# Patient Record
Sex: Male | Born: 2000 | Race: Black or African American | Hispanic: No | Marital: Single | Smoking: Never smoker
Health system: Southern US, Community
[De-identification: ages and names within clinical notes are randomized; demographics above are authoritative.]

---
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
## 2021-03-06 ENCOUNTER — Ambulatory Visit (HOSPITAL_COMMUNITY)
Admission: EM | Admit: 2021-03-06 | Discharge: 2021-03-06 | Disposition: A | Discharge status: Home / Self Care | Payer: Medicaid Other | Attending: Physician Assistant | Admitting: Physician Assistant

## 2021-03-06 ENCOUNTER — Encounter (HOSPITAL_COMMUNITY): Payer: Self-pay

## 2021-03-06 ENCOUNTER — Other Ambulatory Visit: Payer: Self-pay

## 2021-03-06 DIAGNOSIS — R55 Syncope and collapse: Secondary | ICD-10-CM | POA: Diagnosis not present

## 2021-03-06 DIAGNOSIS — R42 Dizziness and giddiness: Secondary | ICD-10-CM | POA: Diagnosis not present

## 2021-03-06 LAB — COMPREHENSIVE METABOLIC PANEL
ALT: 15 U/L (ref 0–44)
AST: 18 U/L (ref 15–41)
Albumin: 4.5 g/dL (ref 3.5–5.0)
Alkaline Phosphatase: 52 U/L (ref 38–126)
Anion gap: 6 (ref 5–15)
BUN: 12 mg/dL (ref 6–20)
CO2: 28 mmol/L (ref 22–32)
Calcium: 9.5 mg/dL (ref 8.9–10.3)
Chloride: 106 mmol/L (ref 98–111)
Creatinine, Ser: 0.94 mg/dL (ref 0.61–1.24)
GFR, Estimated: >60 mL/min (ref >60)
Glucose, Bld: 104 mg/dL — ABNORMAL HIGH (ref 70–99)
Potassium: 4.2 mmol/L (ref 3.5–5.1)
Sodium: 140 mmol/L (ref 135–145)
Total Bilirubin: 0.5 mg/dL (ref 0.3–1.2)
Total Protein: 7.4 g/dL (ref 6.5–8.1)

## 2021-03-06 LAB — CBC WITH DIFFERENTIAL/PLATELET
Abs Immature Granulocytes: 0 10*3/uL (ref 0.00–0.07)
Basophils Absolute: 0 10*3/uL (ref 0.0–0.1)
Basophils Relative: 1 %
Eosinophils Absolute: 0 10*3/uL (ref 0.0–0.5)
Eosinophils Relative: 0 %
HCT: 41.1 % (ref 39.0–52.0)
Hemoglobin: 12.6 g/dL — ABNORMAL LOW (ref 13.0–17.0)
Lymphocytes Relative: 74 %
Lymphs Abs: 3.3 10*3/uL (ref 0.7–4.0)
MCH: 19.6 pg — ABNORMAL LOW (ref 26.0–34.0)
MCHC: 30.7 g/dL (ref 30.0–36.0)
MCV: 64 fL — ABNORMAL LOW (ref 80.0–100.0)
Monocytes Absolute: 0.2 10*3/uL (ref 0.1–1.0)
Monocytes Relative: 4 %
Neutro Abs: 0.9 10*3/uL — ABNORMAL LOW (ref 1.7–7.7)
Neutrophils Relative %: 21 %
Platelets: 238 10*3/uL (ref 150–400)
RBC: 6.42 MIL/uL — ABNORMAL HIGH (ref 4.22–5.81)
RDW: 16.6 % — ABNORMAL HIGH (ref 11.5–15.5)
WBC: 4.4 10*3/uL (ref 4.0–10.5)
nRBC: 0 % (ref 0.0–0.2)

## 2021-03-06 LAB — POCT URINALYSIS DIPSTICK, ED / UC
Bilirubin Urine: NEGATIVE
Glucose, UA: NEGATIVE mg/dL
Leukocytes,Ua: NEGATIVE
Nitrite: NEGATIVE
Protein, ur: NEGATIVE mg/dL
Specific Gravity, Urine: >=1.03 (ref 1.005–1.030)
Urobilinogen, UA: 0.2 mg/dL (ref 0.0–1.0)
pH: 5.5 (ref 5.0–8.0)

## 2021-03-06 LAB — CBG MONITORING, ED: Glucose-Capillary: 94 mg/dL (ref 70–99)

## 2021-03-06 NOTE — Discharge Instructions (Addendum)
Your EKG and blood glucose were normal.  We will contact you if your lab work is abnormal.  Your heart rate did elevate when you change positions.  I would like you to eat small frequent meals that are high in salt and drink lots of fluid.  Please contact cardiology to schedule follow-up appointment for further evaluation.  If you have any worsening symptoms including recurrent episodes, passing out, chest pain, heart racing, shortness of breath you need to be seen immediately.

## 2021-03-06 NOTE — ED Provider Notes (Signed)
[facility]    CSN: [id number] Arrival date & time: 03/06/21  4010      History   Chief Complaint Chief Complaint  Patient presents with   Fatigue    HPI Patrick Cochran is a 20 y.o. male.   Patient presents today companied by his father who help provide the majority of history.  Reports that yesterday when he got to work he was standing there and then suddenly felt weak and fatigued.  He denies any loss of consciousness.  Reports that this episode lasted for few minutes and then resolved without intervention.  He does report that he had not eaten prior to symptoms.  He denies any history of diabetes or hypoglycemia.  He denies personal or family history of arrhythmia.  He denies any medication changes or recent illness.  Reports that he is feeling normal and denies any significant symptoms including lightheadedness, headache, dizziness, chest pain, shortness of breath, body aches.  He denies any recent head trauma.  He does have a history of anemia when he was much younger as well as a strong family history of anemia and multiple family members.  He is interested in having lab work obtained today.  He denies any melena, hematochezia, hematuria.   History reviewed. No pertinent past medical history.  There are no problems to display for this patient.   History reviewed. No pertinent surgical history.     Home Medications    Prior to Admission medications   Not on File    Family History Family History  Problem Relation Age of Onset   Healthy Mother    Anemia Mother    Healthy Father    Anemia Sister     Social History Social History   Tobacco Use   Smoking status: Never   Smokeless tobacco: Never  Vaping Use   Vaping Use: Never used  Substance Use Topics   Alcohol use: No   Drug use: Not Currently     Allergies   Patient has no known allergies.   Review of Systems Review of Systems  Constitutional:  Positive for fatigue. Negative for  activity change, appetite change and fever.  HENT:  Negative for congestion, sinus pressure, sneezing and sore throat.   Respiratory:  Negative for cough and shortness of breath.   Cardiovascular:  Negative for chest pain, palpitations and leg swelling.  Gastrointestinal:  Negative for abdominal pain, diarrhea, nausea and vomiting.  Musculoskeletal:  Negative for arthralgias and myalgias.  Neurological:  Positive for light-headedness. Negative for dizziness, weakness and headaches.    Physical Exam Triage Vital Signs ED Triage Vitals  Enc Vitals Group     BP 03/06/21 0955 115/78     Pulse Rate 03/06/21 0955 63     Resp 03/06/21 0955 19     Temp 03/06/21 0955 98.6 F (37 C)     Temp src --      SpO2 03/06/21 0955 100 %     Weight --      Height --      Head Circumference --      Peak Flow --      Pain Score 03/06/21 0954 0     Pain Loc --      Pain Edu? --      Excl. in GC? --    Orthostatic VS for the past 24 hrs:  BP- Lying Pulse- Lying BP- Sitting Pulse- Sitting  03/06/21 1103 112/72 52 108/68 59    Updated  Vital Signs BP 115/78   Pulse 63   Temp 98.6 F (37 C)   Resp 19   SpO2 100%   Visual Acuity Right Eye Distance:   Left Eye Distance:   Bilateral Distance:    Right Eye Near:   Left Eye Near:    Bilateral Near:     Physical Exam Vitals reviewed.  Constitutional:      General: He is awake.     Appearance: Normal appearance. He is normal weight. He is not ill-appearing.     Comments: Very pleasant male appears stated age in no acute distress  HENT:     Head: Normocephalic and atraumatic.     Right Ear: Tympanic membrane, ear canal and external ear normal. Tympanic membrane is not erythematous or bulging.     Left Ear: Tympanic membrane, ear canal and external ear normal. Tympanic membrane is not erythematous or bulging.     Nose: Nose normal.     Mouth/Throat:     Tongue: Tongue does not deviate from midline.     Pharynx: Uvula midline. No  oropharyngeal exudate or posterior oropharyngeal erythema.  Eyes:     Extraocular Movements: Extraocular movements intact.     Pupils: Pupils are equal, round, and reactive to light.  Cardiovascular:     Rate and Rhythm: Normal rate and regular rhythm.     Heart sounds: Normal heart sounds, S1 normal and S2 normal. No murmur heard. Pulmonary:     Effort: Pulmonary effort is normal. No accessory muscle usage or respiratory distress.     Breath sounds: Normal breath sounds. No stridor. No wheezing, rhonchi or rales.     Comments: Clear to auscultation bilaterally Abdominal:     General: Bowel sounds are normal.     Palpations: Abdomen is soft.     Tenderness: There is no abdominal tenderness.  Musculoskeletal:     Cervical back: Normal range of motion and neck supple.     Comments: Strength 5/5 bilateral upper and lower extremities  Lymphadenopathy:     Head:     Right side of head: No submental, submandibular or tonsillar adenopathy.     Left side of head: No submental, submandibular or tonsillar adenopathy.     Cervical: No cervical adenopathy.  Neurological:     General: No focal deficit present.     Mental Status: He is alert and oriented to person, place, and time.     Cranial Nerves: Cranial nerves are intact.     Motor: Motor function is intact.     Gait: Gait is intact.     Comments: Cranial nerves II through XII intact.  No focal neurological defect on exam.  Psychiatric:        Behavior: Behavior is cooperative.     UC Treatments / Results  Labs (all labs ordered are listed, but only abnormal results are displayed) Labs Reviewed  POCT URINALYSIS DIPSTICK, ED / UC - Abnormal; Notable for the following components:      Result Value   Ketones, ur TRACE (*)    Hgb urine dipstick TRACE (*)    All other components within normal limits  URINE CULTURE  CBC WITH DIFFERENTIAL/PLATELET  COMPREHENSIVE METABOLIC PANEL  CBG MONITORING, ED    EKG   Radiology No results  found.  Procedures Procedures (including critical care time)  Medications Ordered in UC Medications - No data to display  Initial Impression / Assessment and Plan / UC Course  I have reviewed the  triage vital signs and the nursing notes.  Pertinent labs & imaging results that were available during my care of the patient were reviewed by me and considered in my medical decision making (see chart for details).      EKG obtained showed sinus bradycardia without ischemic changes unchanged from 04/21/2018 tracing.  Orthostatic vital signs essentially normal.  Patient did have an elevated heart rate when going from sitting to standing position but this does not meet criteria for POTS.  Given clinical presentation concern for autonomic condition and patient was encouraged to drink plenty of fluid.  Fasting glucose was normal.  CBC and CMP obtained today-results pending.  Patient is currently asymptomatic and doing well.  Encouraged him to eat small frequent meals that are high in sodium and drink plenty of fluid.  Discussed alarm symptoms that warrant emergent evaluation.  He was given contact information for cardiologist for further evaluation and management encouraged to call them to schedule appointment.  Strict return precautions given to which patient expressed understanding.  Billed based on time this patient went was seen for 40 minutes performing H&P, ordering labs and EKG, interpreting studies, arranging follow-up, discussing management.  Final Clinical Impressions(s) / UC Diagnoses   Final diagnoses:  Episodic lightheadedness  Near syncope     Discharge Instructions      Your EKG and blood glucose were normal.  We will contact you if your lab work is abnormal.  Your heart rate did elevate when you change positions.  I would like you to eat small frequent meals that are high in salt and drink lots of fluid.  Please contact cardiology to schedule follow-up appointment for further  evaluation.  If you have any worsening symptoms including recurrent episodes, passing out, chest pain, heart racing, shortness of breath you need to be seen immediately.     ED Prescriptions   None    PDMP not reviewed this encounter.   Jeani Hawking, PA-C 03/06/21 1116

## 2021-03-06 NOTE — ED Triage Notes (Signed)
Pt presents with complaints of feeling syncopal yesterday. Reports he had a moment of 2 minutes where he felt light headed, blurred vision and like he couldn't hear anything. He did not pass out but has been fatigued over the last few days. Concerned for anemia. No neuro deficits during intake.

## 2021-03-07 LAB — URINE CULTURE: Culture: NO GROWTH

## 2021-12-06 ENCOUNTER — Emergency Department (HOSPITAL_COMMUNITY): Payer: Medicaid Other

## 2021-12-06 ENCOUNTER — Other Ambulatory Visit: Payer: Self-pay

## 2021-12-06 ENCOUNTER — Emergency Department (HOSPITAL_COMMUNITY)
Admission: EM | Admit: 2021-12-06 | Discharge: 2021-12-06 | Disposition: A | Discharge status: Home / Self Care | Payer: Medicaid Other | Attending: Emergency Medicine | Admitting: Emergency Medicine

## 2021-12-06 ENCOUNTER — Encounter (HOSPITAL_COMMUNITY): Payer: Self-pay | Admitting: Emergency Medicine

## 2021-12-06 DIAGNOSIS — D649 Anemia, unspecified: Secondary | ICD-10-CM | POA: Insufficient documentation

## 2021-12-06 DIAGNOSIS — R600 Localized edema: Secondary | ICD-10-CM | POA: Insufficient documentation

## 2021-12-06 DIAGNOSIS — M25472 Effusion, left ankle: Secondary | ICD-10-CM

## 2021-12-06 LAB — CBC WITH DIFFERENTIAL/PLATELET
Abs Immature Granulocytes: 0.01 10*3/uL (ref 0.00–0.07)
Basophils Absolute: 0 10*3/uL (ref 0.0–0.1)
Basophils Relative: 1 %
Eosinophils Absolute: 0.1 10*3/uL (ref 0.0–0.5)
Eosinophils Relative: 2 %
HCT: 39.5 % (ref 39.0–52.0)
Hemoglobin: 11.8 g/dL — ABNORMAL LOW (ref 13.0–17.0)
Immature Granulocytes: 0 %
Lymphocytes Relative: 61 %
Lymphs Abs: 2.6 10*3/uL (ref 0.7–4.0)
MCH: 19.4 pg — ABNORMAL LOW (ref 26.0–34.0)
MCHC: 29.9 g/dL — ABNORMAL LOW (ref 30.0–36.0)
MCV: 65 fL — ABNORMAL LOW (ref 80.0–100.0)
Monocytes Absolute: 0.4 10*3/uL (ref 0.1–1.0)
Monocytes Relative: 9 %
Neutro Abs: 1.1 10*3/uL — ABNORMAL LOW (ref 1.7–7.7)
Neutrophils Relative %: 27 %
Platelets: 258 10*3/uL (ref 150–400)
RBC: 6.08 MIL/uL — ABNORMAL HIGH (ref 4.22–5.81)
RDW: 15.9 % — ABNORMAL HIGH (ref 11.5–15.5)
WBC: 4.2 10*3/uL (ref 4.0–10.5)
nRBC: 0 % (ref 0.0–0.2)

## 2021-12-06 LAB — COMPREHENSIVE METABOLIC PANEL
ALT: 45 U/L — ABNORMAL HIGH (ref 0–44)
AST: 31 U/L (ref 15–41)
Albumin: 3.9 g/dL (ref 3.5–5.0)
Alkaline Phosphatase: 61 U/L (ref 38–126)
Anion gap: 5 (ref 5–15)
BUN: 13 mg/dL (ref 6–20)
CO2: 27 mmol/L (ref 22–32)
Calcium: 8.9 mg/dL (ref 8.9–10.3)
Chloride: 110 mmol/L (ref 98–111)
Creatinine, Ser: 0.9 mg/dL (ref 0.61–1.24)
GFR, Estimated: >60 mL/min (ref >60)
Glucose, Bld: 106 mg/dL — ABNORMAL HIGH (ref 70–99)
Potassium: 4.4 mmol/L (ref 3.5–5.1)
Sodium: 142 mmol/L (ref 135–145)
Total Bilirubin: 0.4 mg/dL (ref 0.3–1.2)
Total Protein: 6.7 g/dL (ref 6.5–8.1)

## 2021-12-06 NOTE — Discharge Instructions (Addendum)
You were evaluated in the Emergency Department and after careful evaluation, we did not find any emergent condition requiring admission or further testing in the hospital. ? ?Your exam/testing today was overall reassuring.  You have no blood clot, lab abnormalities, or fractures. ? ?Please return to the Emergency Department if you experience any worsening of your condition.  Thank you for allowing [place] to be a part of your care.  Please follow-up with your primary care for further evaluation of your left ankle and right hand swelling. ?

## 2021-12-06 NOTE — ED Provider Notes (Signed)
?[facility] EMERGENCY DEPARTMENT ?Provider Note ? ? ?CSN: [id number] ?Arrival date & time: 12/06/21  1452 ? ?  ? ?History ? ?Chief Complaint  ?Patient presents with  ? Joint Swelling  ?  Right hand bilateral foot  ? ? ?Patrick Cochran is a 21 y.o. male with no past medical history presenting today for left lower ankle swelling that began a couple days ago and then his right hand near his thumb started to swell today and he got nervous and came in.  Patient has been fasting for Ramadan.  He is only been doing this for a few years.  Denies any trauma.  Denies any pain.  Has full range of motion of all joints.  Patient has no swelling of elbows or knees.  He does report his left hand is also seem to little larger.  Patient is right-handed.  Denies any drug use, sexual activity, alcohol use, or tobacco use. ? ?HPI ? ?  ? ?Home Medications ?Prior to Admission medications   ?Not on File  ?   ? ?Allergies    ?Patient has no known allergies.   ? ?Review of Systems   ?Review of Systems  ?Musculoskeletal:  Positive for joint swelling. Negative for arthralgias, gait problem and myalgias.  ? ?Physical Exam ?Updated Vital Signs ?BP 136/74 (BP Location: Right Arm)   Pulse 78   Temp 98.2 ?F (36.8 ?C) (Oral)   Resp 18   Ht 5\' 9"  (1.753 m)   Wt 65.8 kg   SpO2 98%   BMI 21.41 kg/m?  ?Physical Exam ?Vitals and nursing note reviewed.  ?Constitutional:   ?   General: He is not in acute distress. ?   Appearance: He is well-developed.  ?HENT:  ?   Head: Normocephalic and atraumatic.  ?Eyes:  ?   Conjunctiva/sclera: Conjunctivae normal.  ?Cardiovascular:  ?   Rate and Rhythm: Normal rate and regular rhythm.  ?   Heart sounds: No murmur heard. ?Pulmonary:  ?   Effort: Pulmonary effort is normal. No respiratory distress.  ?   Breath sounds: Normal breath sounds.  ?Abdominal:  ?   Palpations: Abdomen is soft.  ?   Tenderness: There is no abdominal tenderness.  ?Musculoskeletal:     ?   General: Swelling present. No  tenderness or signs of injury.  ?   Cervical back: Neck supple.  ?Skin: ?   General: Skin is warm and dry.  ?   Capillary Refill: Capillary refill takes less than 2 seconds.  ?Neurological:  ?   General: No focal deficit present.  ?   Mental Status: He is alert and oriented to person, place, and time.  ?   Motor: No weakness.  ?Psychiatric:     ?   Mood and Affect: Mood normal.  ? ? ?ED Results / Procedures / Treatments   ?Labs ?(all labs ordered are listed, but only abnormal results are displayed) ?Labs Reviewed  ?CBC WITH DIFFERENTIAL/PLATELET - Abnormal; Notable for the following components:  ?    Result Value  ? RBC 6.08 (*)   ? Hemoglobin 11.8 (*)   ? MCV 65.0 (*)   ? MCH 19.4 (*)   ? MCHC 29.9 (*)   ? RDW 15.9 (*)   ? Neutro Abs 1.1 (*)   ? All other components within normal limits  ?COMPREHENSIVE METABOLIC PANEL - Abnormal; Notable for the following components:  ? Glucose, Bld 106 (*)   ? ALT 45 (*)   ?  All other components within normal limits  ? ? ?EKG ?None ? ?Radiology ?DG Ankle 2 Views Left ? ?Result Date: 12/06/2021 ?CLINICAL DATA:  Swelling EXAM: LEFT ANKLE - 2 VIEW COMPARISON:  None. FINDINGS: Frontal and lateral views of the left ankle are obtained. No fracture, subluxation, or dislocation. Joint spaces are well preserved. Soft tissues are normal. IMPRESSION: 1. Unremarkable left ankle. Electronically Signed   By: Sharlet SalinaMichael  Brown M.D.   On: 12/06/2021 16:33  ? ?DG Hand 2 View Right ? ?Result Date: 12/06/2021 ?CLINICAL DATA:  Swelling EXAM: RIGHT HAND - 2 VIEW COMPARISON:  None. FINDINGS: Frontal and lateral views of the right hand are obtained. No fracture, subluxation, or dislocation. Joint spaces are well preserved. Soft tissues are normal. IMPRESSION: 1. Unremarkable right hand. Electronically Signed   By: Sharlet SalinaMichael  Brown M.D.   On: 12/06/2021 16:34  ? ?DG Foot Complete Left ? ?Result Date: 12/06/2021 ?CLINICAL DATA:  Swelling EXAM: LEFT FOOT - COMPLETE 3+ VIEW COMPARISON:  None. FINDINGS: Frontal,  oblique, lateral views of the left foot are obtained. No acute fracture, subluxation, or dislocation. Joint spaces are well preserved. Soft tissues are unremarkable. IMPRESSION: 1. Unremarkable left foot. Electronically Signed   By: Sharlet SalinaMichael  Brown M.D.   On: 12/06/2021 16:32  ? ?VAS US LOWER EXTREMITY VENOUS (DVT) (7a-7p) ? ?Result Date: 12/06/2021 ? Lower Venous DVT Study Patient Name:  Patrick Cochran  Date of Exam:   12/06/2021 Medical Rec #: [id number]        Accession #:    [id number] Date of Birth: 04/02/2001         Patient Gender: M Patient Age:   89 years [facility] Procedure:      VAS US LOWER EXTREMITY VENOUS (DVT) Referring Phys: Marianna FussICHARD DYKSTRA --------------------------------------------------------------------------------  Indications: Swelling LT leg.  Comparison Study: No prior studies. Performing Technologist: Jean Rosenthalachel Hodge RDMS, RVT  Examination Guidelines: A complete evaluation includes B-mode imaging, spectral Doppler, color Doppler, and power Doppler as needed of all accessible portions of each vessel. Bilateral testing is considered an integral part of a complete examination. Limited examinations for reoccurring indications may be performed as noted. The reflux portion of the exam is performed with the patient in reverse Trendelenburg.  +-----+---------------+---------+-----------+----------+--------------+ RIGHTCompressibilityPhasicitySpontaneityPropertiesThrombus Aging +-----+---------------+---------+-----------+----------+--------------+ CFV  Full           Yes      Yes                                 +-----+---------------+---------+-----------+----------+--------------+   +---------+---------------+---------+-----------+----------+--------------+ LEFT     CompressibilityPhasicitySpontaneityPropertiesThrombus Aging +---------+---------------+---------+-----------+----------+--------------+ CFV      Full           Yes      Yes                                  +---------+---------------+---------+-----------+----------+--------------+ SFJ      Full                                                        +---------+---------------+---------+-----------+----------+--------------+ FV Prox  Full                                                        +---------+---------------+---------+-----------+----------+--------------+  FV Mid   Full                                                        +---------+---------------+---------+-----------+----------+--------------+ FV DistalFull                                                        +---------+---------------+---------+-----------+----------+--------------+ PFV      Full                                                        +---------+---------------+---------+-----------+----------+--------------+ POP      Full           Yes      Yes                                 +---------+---------------+---------+-----------+----------+--------------+ PTV      Full                                                        +---------+---------------+---------+-----------+----------+--------------+ PERO     Full                                                        +---------+---------------+---------+-----------+----------+--------------+ Gastroc  Full                                                        +---------+---------------+---------+-----------+----------+--------------+     Summary: RIGHT: - No evidence of common femoral vein obstruction.  LEFT: - There is no evidence of deep vein thrombosis in the lower extremity.  - No cystic structure found in the popliteal fossa.  *See table(s) above for measurements and observations.    Preliminary    ? ?Procedures ?Procedures  ? ? ?Medications Ordered in ED ?Medications - No data to display ? ?ED Course/ Medical Decision Making/ A&P ?  ?                        ?Medical Decision Making ?Amount and/or Complexity  of Data Reviewed ?Labs: ordered. ?Radiology: ordered. ? ? ?On exam, mild edema to left ankle and foot.  Mild edema between thenar eminence and first finger of right hand in comparison to left which may just be an anatomic variant due to being right-handed verse left-handed.  No tenderness to palpation o

## 2021-12-06 NOTE — ED Notes (Signed)
Patient transported to X-ray 

## 2022-10-31 IMAGING — DX DG HAND 2V*R*
2 series · 2 of 2 positions shown · non-contrast
Comparison: None.

CLINICAL DATA: Swelling

EXAM:
RIGHT HAND - 2 VIEW

[hand pa]
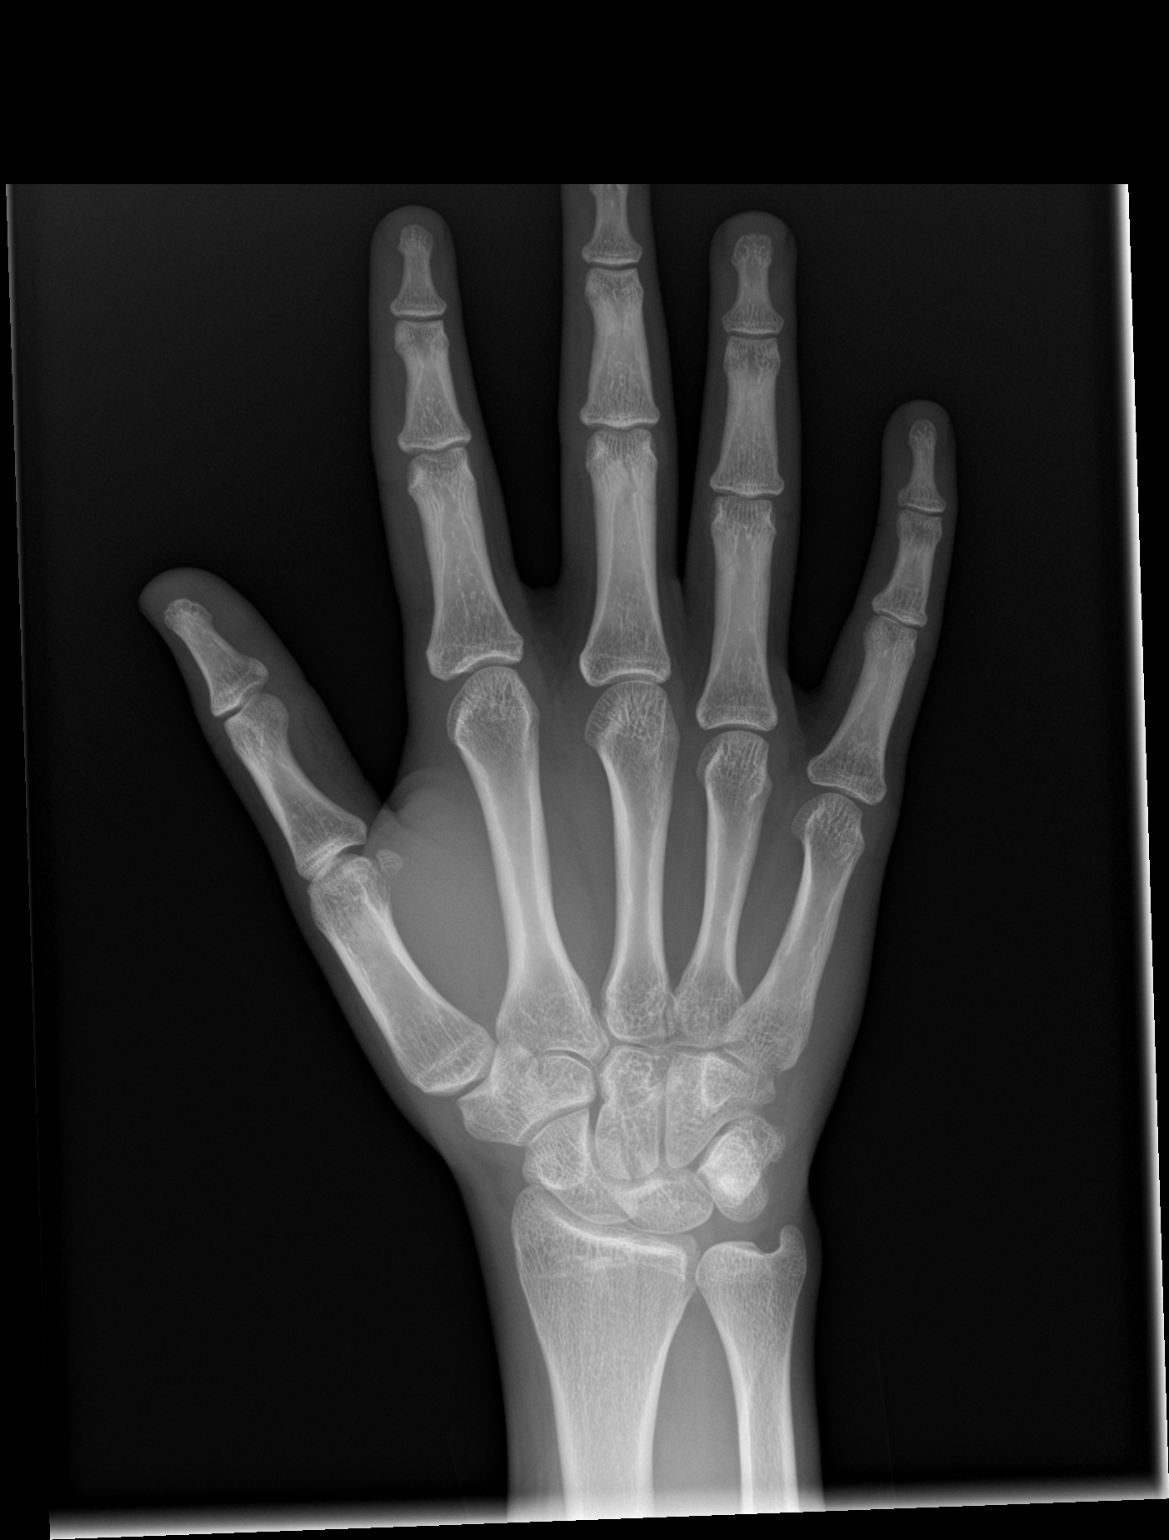

[hand lat]
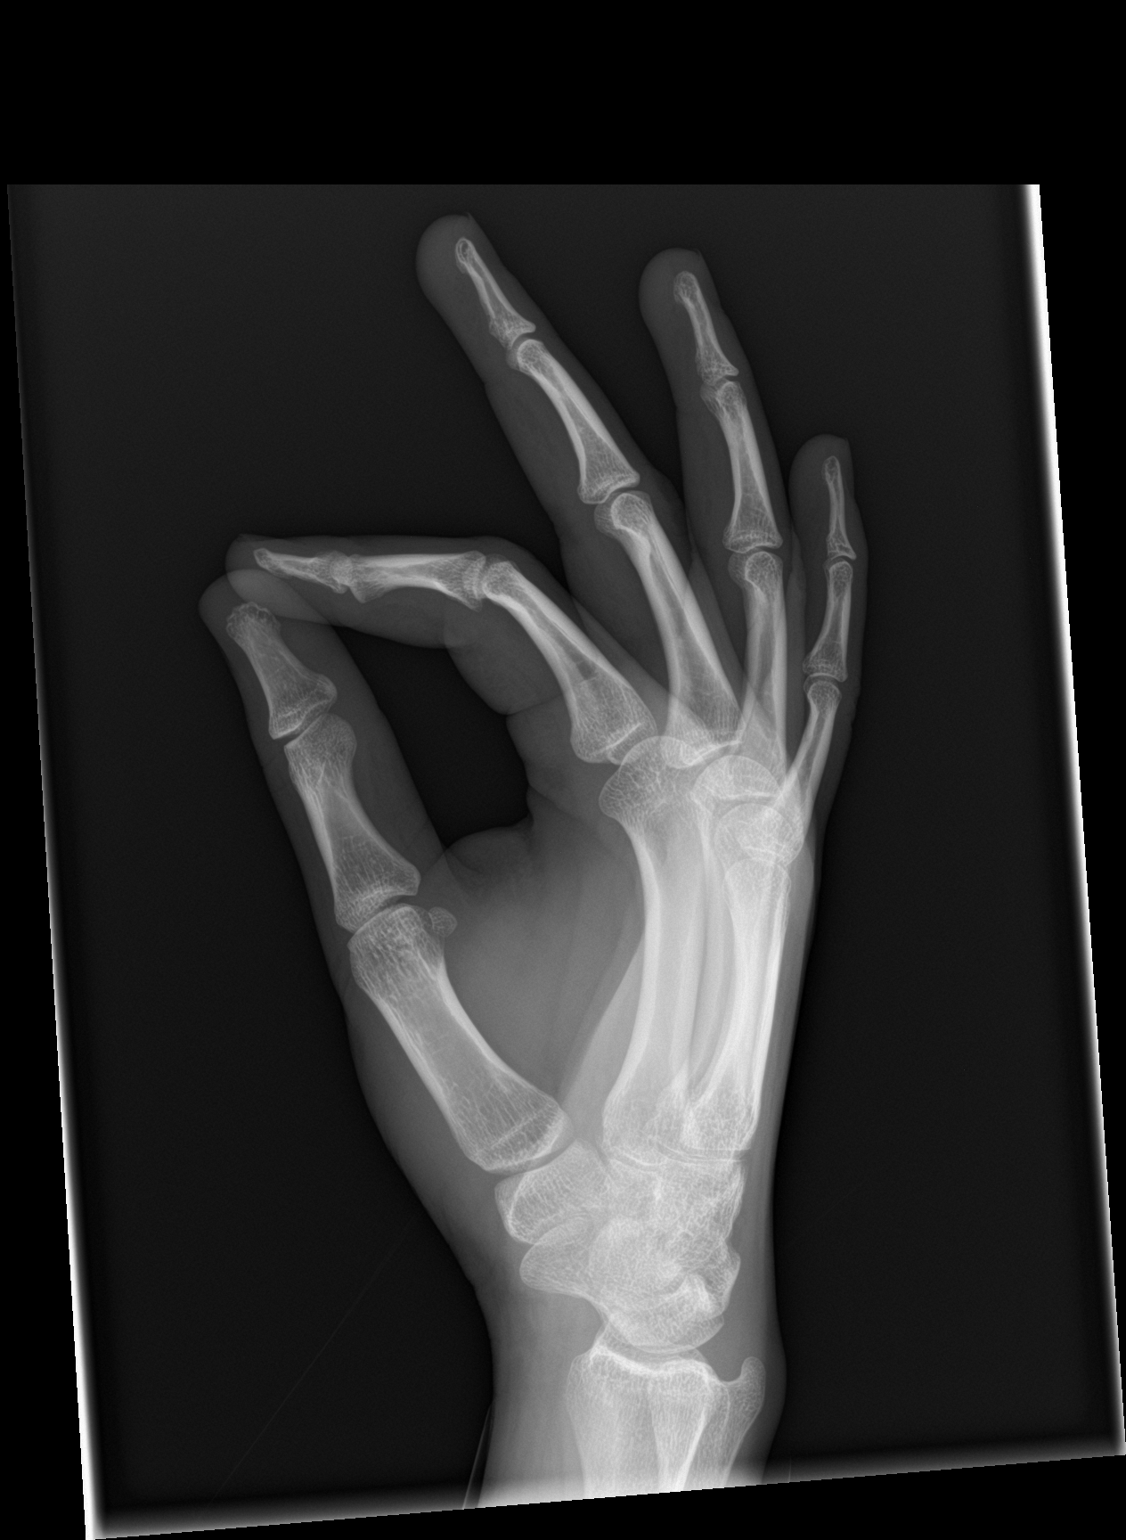

[2 of 2 positions shown; findings below may reference images not displayed]

FINDINGS: Frontal and lateral views of the right hand are obtained. No
fracture, subluxation, or dislocation. Joint spaces are well
preserved. Soft tissues are normal.
IMPRESSION: 1. Unremarkable right hand.

## 2023-12-11 ENCOUNTER — Encounter (HOSPITAL_COMMUNITY): Payer: Self-pay

## 2023-12-11 ENCOUNTER — Ambulatory Visit (HOSPITAL_COMMUNITY)
Admission: EM | Admit: 2023-12-11 | Discharge: 2023-12-11 | Disposition: A | Discharge status: Home / Self Care | Attending: Emergency Medicine | Admitting: Emergency Medicine

## 2023-12-11 DIAGNOSIS — K649 Unspecified hemorrhoids: Secondary | ICD-10-CM | POA: Diagnosis not present

## 2023-12-11 MED ORDER — HYDROCORTISONE (PERIANAL) 2.5 % EX CREA: 1.0000 | TOPICAL_CREAM | Freq: Two times a day (BID) | CUTANEOUS | 0 refills | Status: DC

## 2023-12-11 NOTE — Discharge Instructions (Addendum)
 You have a hemorrhoid.  You can use the rectal cream twice daily.  Warm Epsom salt baths can help soothe the area as well.  Avoid straining with your bowel movements, as this can worsen hemorrhoids.  Consider adding an over-the-counter fiber supplement or stool softener such as Colace to your daily diet.  Ensure you are drinking at least 64 ounces of water daily and being physically active most days per week, as this can help move along the bowels.  Return to clinic for any new or urgent symptoms.

## 2023-12-11 NOTE — ED Triage Notes (Signed)
 Patient here today with c/o soreness on rectum when sitting and while having a BM X 2 days. Patient is concerned with hemorrhoids.

## 2023-12-11 NOTE — ED Provider Notes (Signed)
 [facility]    CSN: [id number] Arrival date & time: 12/11/23  1219      History   Chief Complaint Chief Complaint  Patient presents with   Hemorrhoids    HPI Patrick Cochran is a 23 y.o. male.   Patient presents to clinic over concern of a sore painful tender area to the rectum that he noticed is present when sitting and while having bowel movements.  There is a little sore for the past few days but noticed the area over the past 2 days.  He is concerned that he may be hemorrhoids.  Will occasionally struggle with constipation but has been having more frequent bowel movements recently and has been straining.  Stool is been soft.  Denies any blood in the toilet or in stool.  The history is provided by the patient and medical records.    History reviewed. No pertinent past medical history.  There are no active problems to display for this patient.   History reviewed. No pertinent surgical history.     Home Medications    Prior to Admission medications   Medication Sig Start Date End Date Taking? Authorizing Provider  hydrocortisone (ANUSOL-HC) 2.5 % rectal cream Place 1 Application rectally 2 (two) times daily. 12/11/23  Yes Chantee Cerino, [place], FNP    Family History Family History  Problem Relation Age of Onset   Healthy Mother    Anemia Mother    Healthy Father    Anemia Sister     Social History Social History   Tobacco Use   Smoking status: Never   Smokeless tobacco: Never  Vaping Use   Vaping status: Never Used  Substance Use Topics   Alcohol use: No   Drug use: Not Currently     Allergies   Patient has no known allergies.   Review of Systems Review of Systems  Per HPI  Physical Exam Triage Vital Signs ED Triage Vitals  Encounter Vitals Group     BP 12/11/23 1240 110/73     Systolic BP Percentile --      Diastolic BP Percentile --      Pulse Rate 12/11/23 1240 68     Resp 12/11/23 1240 16     Temp 12/11/23 1240 98.2 F  (36.8 C)     Temp Source 12/11/23 1240 Oral     SpO2 12/11/23 1240 97 %     Weight 12/11/23 1241 144 lb (65.3 kg)     Height 12/11/23 1241 5\' 8"  (1.727 m)     Head Circumference --      Peak Flow --      Pain Score 12/11/23 1242 6     Pain Loc --      Pain Education --      Exclude from Growth Chart --    No data found.  Updated Vital Signs BP 110/73 (BP Location: Left Arm)   Pulse 68   Temp 98.2 F (36.8 C) (Oral)   Resp 16   Ht 5\' 8"  (1.727 m)   Wt 144 lb (65.3 kg)   SpO2 97%   BMI 21.90 kg/m   Visual Acuity Right Eye Distance:   Left Eye Distance:   Bilateral Distance:    Right Eye Near:   Left Eye Near:    Bilateral Near:     Physical Exam Vitals and nursing note reviewed. Exam conducted with a chaperone present.  Constitutional:      Appearance: Normal appearance.  HENT:  Head: Normocephalic.     Nose: Nose normal.     Mouth/Throat:     Mouth: Mucous membranes are moist.  Eyes:     Conjunctiva/sclera: Conjunctivae normal.  Pulmonary:     Effort: Pulmonary effort is normal. No respiratory distress.  Genitourinary:   Skin:    General: Skin is warm and dry.  Neurological:     General: No focal deficit present.     Mental Status: He is alert.  Psychiatric:        Mood and Affect: Mood normal.        Behavior: Behavior is cooperative.      UC Treatments / Results  Labs (all labs ordered are listed, but only abnormal results are displayed) Labs Reviewed - No data to display  EKG   Radiology No results found.  Procedures Procedures (including critical care time)  Medications Ordered in UC Medications - No data to display  Initial Impression / Assessment and Plan / UC Course  I have reviewed the triage vital signs and the nursing notes.  Pertinent labs & imaging results that were available during my care of the patient were reviewed by me and considered in my medical decision making (see chart for details).  Vitals and triage  reviewed, patient is hemodynamically stable.  Chaperone present for rectal exam reveals 1 cm round hemorrhoid that is firm and tender to palpation.  Anus without fissures, without erythema or warmth, low concern for bacterial infection at this time.  Symptomatic management for hemorrhoids discussed.  Plan of care, follow-up care return precautions given, no questions at this time.     Final Clinical Impressions(s) / UC Diagnoses   Final diagnoses:  Hemorrhoids, unspecified hemorrhoid type     Discharge Instructions      You have a hemorrhoid.  You can use the rectal cream twice daily.  Warm Epsom salt baths can help soothe the area as well.  Avoid straining with your bowel movements, as this can worsen hemorrhoids.  Consider adding an over-the-counter fiber supplement or stool softener such as Colace to your daily diet.  Ensure you are drinking at least 64 ounces of water daily and being physically active most days per week, as this can help move along the bowels.  Return to clinic for any new or urgent symptoms.    ED Prescriptions     Medication Sig Dispense Auth. Provider   hydrocortisone (ANUSOL-HC) 2.5 % rectal cream Place 1 Application rectally 2 (two) times daily. 30 g Velvia Mehrer, [place], [place]      PDMP not reviewed this encounter.   Jalaya Sarver, [place] N, [place] 12/11/23 1320

## 2024-01-17 ENCOUNTER — Encounter (HOSPITAL_COMMUNITY): Payer: Self-pay

## 2024-01-17 ENCOUNTER — Ambulatory Visit (HOSPITAL_COMMUNITY)
Admission: EM | Admit: 2024-01-17 | Discharge: 2024-01-17 | Disposition: A | Discharge status: Home / Self Care | Attending: Family Medicine | Admitting: Family Medicine

## 2024-01-17 DIAGNOSIS — J069 Acute upper respiratory infection, unspecified: Secondary | ICD-10-CM

## 2024-01-17 MED ORDER — METHYLPREDNISOLONE 4 MG PO TBPK: ORAL_TABLET | ORAL | 0 refills | Status: AC

## 2024-01-17 MED ORDER — BENZONATATE 200 MG PO CAPS: 200.0000 mg | ORAL_CAPSULE | Freq: Three times a day (TID) | ORAL | 0 refills | Status: AC | PRN

## 2024-01-17 NOTE — ED Triage Notes (Signed)
 Patient here today with c/o cough, chest congestion, runny nose, headache, weakness, and ST X 3 days. He has taken Tylenol and Dayquil with little relief. Hot steam helps. No known sick contacts.

## 2024-01-17 NOTE — Discharge Instructions (Addendum)
 You were seen today for upper respiratory symptoms.  Your symptoms appear viral at this time.  I have sent out a medication for the cough, as well as a steroid pack for wheezing.  You should get plenty of rest and increase fluids.  You may use tyelnol/motrin  for any body aches or pains.  Please return if you are not improving or worsening despite treatment.

## 2024-01-17 NOTE — ED Provider Notes (Signed)
 [facility]    CSN: [id number] Arrival date & time: 01/17/24  1309      History   Chief Complaint Chief Complaint  Patient presents with   Cough    HPI Patrick Cochran is a 23 y.o. male.    Cough Associated symptoms: rhinorrhea and sore throat    Patient is here for URI symptoms x 3 days.  Having cough, chest congestion, runny nose, headaches, sore throat.  No fevers that he is aware of.  He feels a bit of wheezing.  No sob noted.  He has used tylenol/dayquil with little help.       History reviewed. No pertinent past medical history.  There are no active problems to display for this patient.   History reviewed. No pertinent surgical history.     Home Medications    Prior to Admission medications   Not on File    Family History Family History  Problem Relation Age of Onset   Healthy Mother    Anemia Mother    Healthy Father    Anemia Sister     Social History Social History   Tobacco Use   Smoking status: Never   Smokeless tobacco: Never  Vaping Use   Vaping status: Never Used  Substance Use Topics   Alcohol use: No   Drug use: Not Currently     Allergies   Patient has no known allergies.   Review of Systems Review of Systems  Constitutional: Negative.   HENT:  Positive for congestion, rhinorrhea and sore throat.   Respiratory:  Positive for cough.   Gastrointestinal: Negative.   Genitourinary: Negative.   Musculoskeletal: Negative.   Psychiatric/Behavioral: Negative.       Physical Exam Triage Vital Signs ED Triage Vitals  Encounter Vitals Group     BP 01/17/24 1442 111/71     Systolic BP Percentile --      Diastolic BP Percentile --      Pulse Rate 01/17/24 1442 80     Resp 01/17/24 1442 16     Temp 01/17/24 1442 98.3 F (36.8 C)     Temp Source 01/17/24 1442 Oral     SpO2 01/17/24 1442 97 %     Weight 01/17/24 1440 120 lb (54.4 kg)     Height 01/17/24 1440 5\' 8"  (1.727 m)     Head Circumference --       Peak Flow --      Pain Score 01/17/24 1439 8     Pain Loc --      Pain Education --      Exclude from Growth Chart --    No data found.  Updated Vital Signs BP 111/71 (BP Location: Right Arm)   Pulse 80   Temp 98.3 F (36.8 C) (Oral)   Resp 16   Ht 5\' 8"  (1.727 m)   Wt 54.4 kg   SpO2 97%   BMI 18.25 kg/m   Visual Acuity Right Eye Distance:   Left Eye Distance:   Bilateral Distance:    Right Eye Near:   Left Eye Near:    Bilateral Near:     Physical Exam Constitutional:      General: He is not in acute distress.    Appearance: Normal appearance. He is normal weight. He is not ill-appearing or toxic-appearing.  HENT:     Nose: Congestion present. No rhinorrhea.     Mouth/Throat:     Mouth: Mucous membranes are moist.  Pharynx: No posterior oropharyngeal erythema.  Cardiovascular:     Rate and Rhythm: Normal rate and regular rhythm.  Pulmonary:     Effort: Pulmonary effort is normal.     Breath sounds: Normal breath sounds. No wheezing or rhonchi.  Musculoskeletal:     Cervical back: Normal range of motion and neck supple. No tenderness.  Lymphadenopathy:     Cervical: No cervical adenopathy.  Skin:    General: Skin is warm.  Neurological:     General: No focal deficit present.     Mental Status: He is alert.  Psychiatric:        Mood and Affect: Mood normal.      UC Treatments / Results  Labs (all labs ordered are listed, but only abnormal results are displayed) Labs Reviewed - No data to display  EKG   Radiology No results found.  Procedures Procedures (including critical care time)  Medications Ordered in UC Medications - No data to display  Initial Impression / Assessment and Plan / UC Course  I have reviewed the triage vital signs and the nursing notes.  Pertinent labs & imaging results that were available during my care of the patient were reviewed by me and considered in my medical decision making (see chart for details).  Final  Clinical Impressions(s) / UC Diagnoses   Final diagnoses:  Viral URI with cough     Discharge Instructions      You were seen today for upper respiratory symptoms.  Your symptoms appear viral at this time.  I have sent out a medication for the cough, as well as a steroid pack for wheezing.  You should get plenty of rest and increase fluids.  You may use tyelnol/motrin  for any body aches or pains.  Please return if you are not improving or worsening despite treatment.     ED Prescriptions     Medication Sig Dispense Auth. Provider   benzonatate  (TESSALON ) 200 MG capsule Take 1 capsule (200 mg total) by mouth 3 (three) times daily as needed for cough. 21 capsule Roberts Bon, MD   methylPREDNISolone (MEDROL DOSEPAK) 4 MG TBPK tablet Take as directed 1 each Lesle Ras, MD      PDMP not reviewed this encounter.   Lesle Ras, MD 01/17/24 [phone]

## 2024-06-17 ENCOUNTER — Emergency Department (HOSPITAL_COMMUNITY)
Admission: EM | Admit: 2024-06-17 | Discharge: 2024-06-17 | Disposition: A | Discharge status: Home / Self Care | Attending: Emergency Medicine | Admitting: Emergency Medicine

## 2024-06-17 ENCOUNTER — Other Ambulatory Visit: Payer: Self-pay

## 2024-06-17 ENCOUNTER — Encounter (HOSPITAL_COMMUNITY): Payer: Self-pay | Admitting: *Deleted

## 2024-06-17 DIAGNOSIS — S29012A Strain of muscle and tendon of back wall of thorax, initial encounter: Secondary | ICD-10-CM | POA: Insufficient documentation

## 2024-06-17 DIAGNOSIS — M546 Pain in thoracic spine: Secondary | ICD-10-CM | POA: Diagnosis present

## 2024-06-17 DIAGNOSIS — X500XXA Overexertion from strenuous movement or load, initial encounter: Secondary | ICD-10-CM | POA: Diagnosis not present

## 2024-06-17 MED ORDER — IBUPROFEN 800 MG PO TABS
800.0000 mg | ORAL_TABLET | Freq: Once | ORAL | Status: AC
  Administered 2024-06-17: 800 mg via ORAL
  Filled 2024-06-17: qty 1

## 2024-06-17 MED ORDER — METHOCARBAMOL 500 MG PO TABS: 500.0000 mg | ORAL_TABLET | Freq: Two times a day (BID) | ORAL | 0 refills | Status: AC

## 2024-06-17 NOTE — Discharge Instructions (Signed)
 You are seen in the emergency department today for concerns of upper back pain.  I do suspect likely have a strain given lack of any recent injury and your somewhat repetitive movement and motions at work.  I does anti-inflammatory type medications can be helpful such as ibuprofen  and Tylenol for mild to moderate pain.  I have sent a prescription for a medication called Robaxin to help further manage her pain.  Try to avoid aggravating activity as best as you can but this is mostly going to improve with rest and medications.  Once pain is more under control, strength exercises can help reduce the risk of reinjury of the muscles of your upper back.  If you have any concerns for worsening symptoms, return to the emergency department.  Otherwise, please follow-up with your primary care provider within the next 2 weeks.

## 2024-06-17 NOTE — ED Triage Notes (Signed)
 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

## 2024-06-17 NOTE — ED Provider Notes (Signed)
 [place] EMERGENCY DEPARTMENT AT [facility] Provider Note   CSN: [id number] Arrival date & time: 06/17/24  1539     Patient presents with: Back Pain   Patrick Cochran is a 23 y.o. male.  Patient without significant medical history presents the emergency department concerns of upper back pain.  He reports that he has been feeling pain primarily to his upper back when he tries to lift objects.  He states that he does not feel any chest pain or shortness of breath.  No recent injury or fall.  States that this job is newer for him that he started about 1 month ago.  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   Back Pain      Prior to Admission medications   Medication Sig Start Date End Date Taking? Authorizing Provider  methocarbamol (ROBAXIN) 500 MG tablet Take 1 tablet (500 mg total) by mouth 2 (two) times daily. 06/17/24  Yes Salvadore Valvano A, PA-C  benzonatate  (TESSALON ) 200 MG capsule Take 1 capsule (200 mg total) by mouth 3 (three) times daily as needed for cough. 01/17/24   Piontek, Rocky, MD  methylPREDNISolone  (MEDROL  DOSEPAK) 4 MG TBPK tablet Take as directed 01/17/24   Darral Rocky, MD    Allergies: Patient has no known allergies.    Review of Systems  Musculoskeletal:  Positive for back pain.  All other systems reviewed and are negative.   Updated Vital Signs BP 126/69   Pulse 79   Temp 98.2 F (36.8 C)   Resp 14   Ht 5' 8 (1.727 m)   Wt 54.4 kg   SpO2 100%   BMI 18.24 kg/m   Physical Exam Vitals and nursing note reviewed.  Constitutional:      General: He is not in acute distress.    Appearance: He is well-developed.  HENT:     Head: Normocephalic and atraumatic.  Eyes:     Conjunctiva/sclera: Conjunctivae normal.  Cardiovascular:     Rate and Rhythm: Normal rate and regular rhythm.     Heart sounds: No murmur heard. Pulmonary:     Effort: Pulmonary effort is normal. No respiratory distress.     Breath  sounds: Normal breath sounds.  Abdominal:     Palpations: Abdomen is soft.     Tenderness: There is no abdominal tenderness.  Musculoskeletal:        General: Tenderness present. No swelling, deformity or signs of injury. Normal range of motion.       Arms:     Cervical back: Neck supple.     Comments: Tenderness palpation of the paraspinal muscles of the upper back with extension to the portions just medial and inferior to the shoulder blades.  Skin:    General: Skin is warm and dry.     Capillary Refill: Capillary refill takes less than 2 seconds.  Neurological:     Mental Status: He is alert.  Psychiatric:        Mood and Affect: Mood normal.     (all labs ordered are listed, but only abnormal results are displayed) Labs Reviewed - No data to display  EKG: None  Radiology: No results found.   Procedures   Medications Ordered in the ED  ibuprofen  (ADVIL ) tablet 800 mg (has no administration in time range)  Medical Decision Making  This patient presents to the ED for concern of back pain.  Differential diagnosis includes strain of upper back, cervical radiculopathy, shoulder dislocation, clavicular fracture   Medicines ordered and prescription drug management:  I ordered medication including ibuprofen  for pain Reevaluation of the patient after these medicines showed that the patient improved I have reviewed the patients home medicines and have made adjustments as needed   Problem List / ED Course:  Patient without significant medical history presents to the emergency department concerns of back pain.  He reports that he works at [employer] and has had increasing back pain for the last week.  Reports pain is primarily towards his upper back and denies any recent falls, injury, or other sort of trauma.  He does report somewhat frequent repetitive activity with his line of work and has not taken anything to see if this would  alleviate his pain.  Denies any direct impact over this area, falls, or any feelings of tingling or numbness in the upper extremities. Exam reveals tenderness primarily to the paraspinal muscles of the thoracic spine as well as musculature medial and inferior to the shoulder blades.  Pain worsened in these areas with overhead activity and movement. Based on exam and history, suspect likely overuse type injury resulting in strain of the upper back muscles.  Advised use of Tylenol and ibuprofen  for pain control.  Prescription for Robaxin sent to pharmacy for further pain and symptom management.  Return precautions discussed such as concerns for new or worsening symptoms.  Otherwise encouraged close follow-up with primary care provider and discharged home in stable condition.   Social Determinants of Health:  None  Final diagnoses:  Upper back strain, initial encounter    ED Discharge Orders          Ordered    methocarbamol (ROBAXIN) 500 MG tablet  2 times daily        06/17/24 1646               Janetta Vandoren A, PA-C 06/17/24 1650    Armenta Canning, MD 06/26/24 1157
